# Patient Record
Sex: Male | Born: 1980
Health system: Southern US, Community
[De-identification: ages and names within clinical notes are randomized; demographics above are authoritative.]

## PROBLEM LIST (undated history)

## (undated) DIAGNOSIS — M545 Low back pain, unspecified: Secondary | ICD-10-CM

## (undated) DIAGNOSIS — S62309A Unspecified fracture of unspecified metacarpal bone, initial encounter for closed fracture: Secondary | ICD-10-CM

## (undated) DIAGNOSIS — G8929 Other chronic pain: Secondary | ICD-10-CM

## (undated) HISTORY — PX: STOMACH SURGERY: SHX791

## (undated) HISTORY — DX: Low back pain, unspecified: M54.50

## (undated) HISTORY — DX: Other chronic pain: G89.29

## (undated) HISTORY — PX: INGUINAL HERNIA REPAIR: SHX194

---

## 2001-03-07 ENCOUNTER — Emergency Department (HOSPITAL_COMMUNITY): Admission: EM | Admit: 2001-03-07 | Discharge: 2001-03-07 | Payer: Self-pay | Admitting: Emergency Medicine

## 2011-08-14 ENCOUNTER — Emergency Department (HOSPITAL_COMMUNITY)
Admission: EM | Admit: 2011-08-14 | Discharge: 2011-08-14 | Disposition: A | Discharge status: Home / Self Care | Payer: Self-pay | Source: Home / Self Care

## 2011-08-14 ENCOUNTER — Encounter (HOSPITAL_COMMUNITY): Payer: Self-pay | Admitting: *Deleted

## 2011-08-14 ENCOUNTER — Emergency Department (INDEPENDENT_AMBULATORY_CARE_PROVIDER_SITE_OTHER): Payer: Self-pay

## 2011-08-14 DIAGNOSIS — S91119A Laceration without foreign body of unspecified toe without damage to nail, initial encounter: Secondary | ICD-10-CM

## 2011-08-14 DIAGNOSIS — S91109A Unspecified open wound of unspecified toe(s) without damage to nail, initial encounter: Secondary | ICD-10-CM

## 2011-08-14 MED ORDER — HYDROCODONE-ACETAMINOPHEN 5-325 MG PO TABS: 1.0000 | ORAL_TABLET | Freq: Four times a day (QID) | ORAL | Status: AC | PRN

## 2011-08-14 MED ORDER — HYDROCODONE-ACETAMINOPHEN 5-325 MG PO TABS
1.0000 | ORAL_TABLET | Freq: Once | ORAL | Status: AC
  Administered 2011-08-14: 1 via ORAL

## 2011-08-14 MED ORDER — HYDROCODONE-ACETAMINOPHEN 5-325 MG PO TABS
ORAL_TABLET | ORAL | Status: AC
  Filled 2011-08-14: qty 1

## 2011-08-14 MED ORDER — IBUPROFEN 800 MG PO TABS: 800.0000 mg | ORAL_TABLET | Freq: Three times a day (TID) | ORAL | Status: AC

## 2011-08-14 NOTE — ED Notes (Signed)
Hit right little toe on door during the night, laceration to toe, unable to wear shoe, states last tetanus was less than 5 yrs ago

## 2011-08-14 NOTE — ED Provider Notes (Signed)
History     CSN: [id number]  Arrival date & time 08/14/11  1207   None     Chief Complaint  Patient presents with  . Extremity Laceration    (Consider location/radiation/quality/duration/timing/severity/associated sxs/prior treatment) HPI Comments: Pt got up in middle of night to attend to crying child, slipped, fell down, and in process of falling, kicked a wood door with his toe, causing laceration and pain. Denies any other injuries.  Patient is a 31 y.o. male presenting with skin laceration. The history is provided by the patient.  Laceration  The incident occurred 12 to 24 hours ago. The laceration is located on the right foot. Injury mechanism: kicked a wood door. The pain is at a severity of 7/10. The pain is moderate. The pain has been constant since onset. His tetanus status is UTD.    History reviewed. No pertinent past medical history.  History reviewed. No pertinent past surgical history.  History reviewed. No pertinent family history.  History  Substance Use Topics  . Smoking status: Current Everyday Smoker -- 0.5 packs/day  . Smokeless tobacco: Not on file  . Alcohol Use: No      Review of Systems  Constitutional: Negative for fever and chills.  Musculoskeletal:       Toe pain  Skin: Positive for wound.    Allergies  Review of patient's allergies indicates no known allergies.  Home Medications   Current Outpatient Rx  Name Route Sig Dispense Refill  . HYDROCODONE-ACETAMINOPHEN 5-325 MG PO TABS Oral Take 1-2 tablets by mouth every 6 (six) hours as needed for pain. 20 tablet 0  . IBUPROFEN 800 MG PO TABS Oral Take 1 tablet (800 mg total) by mouth 3 (three) times daily. 21 tablet 0    BP 147/86  Pulse 89  Temp(Src) 97.6 F (36.4 C) (Oral)  Resp 14  SpO2 96%  Physical Exam  Constitutional: He appears well-developed and well-nourished. No distress.  HENT:  Head: Normocephalic and atraumatic.  Pulmonary/Chest: Effort normal.  Musculoskeletal:  He exhibits edema and tenderness.       Feet:  Skin:       ED Course  Procedures (including critical care time)  Labs Reviewed - No data to display Dg Toe 5th Right  08/14/2011  *RADIOLOGY REPORT*  Clinical Data: Trauma.  Laceration  RIGHT FIFTH TOE  Comparison: None  Findings: There is no evidence of fracture or dislocation.  There is no evidence of arthropathy or other focal bone abnormality. Soft tissues are unremarkable.  IMPRESSION: No acute findings.  Original Report Authenticated By: Rosealee Albee, M.D.     1. Laceration of toe of right foot       MDM          Cathlyn Parsons, NP 08/14/11 1551

## 2011-08-15 NOTE — ED Provider Notes (Signed)
Medical screening examination/treatment/procedure(s) were performed by non-physician practitioner and as supervising physician I was immediately available for consultation/collaboration.  Luiz Blare MD   Luiz Blare, MD 08/15/11 [phone]

## 2011-08-16 ENCOUNTER — Telehealth (HOSPITAL_COMMUNITY): Payer: Self-pay | Admitting: *Deleted

## 2011-08-17 ENCOUNTER — Telehealth (HOSPITAL_COMMUNITY): Payer: Self-pay | Admitting: *Deleted

## 2011-08-17 NOTE — ED Notes (Signed)
Continue telephone encounter 1/30 @ 1809:  Pt. states the steri strips fell off and now his foot is hurting. I asked him if he was the pt. I told to come back and get the steri strips reapplied.  He said yes but he did not have transportation. I told him we don't call in refills of pain medication. He needs to see the doctor to have wound rechecked.  It is to late now to reapply the strips. It should have been done right away before bacteria could get into the wound. The doctor needs to check for infection. I could not promise he would be given more pain medication. Pt. Asked when we closed and I said 2000. [name] 08/17/2011

## 2012-01-04 ENCOUNTER — Emergency Department (HOSPITAL_COMMUNITY)
Admission: EM | Admit: 2012-01-04 | Discharge: 2012-01-04 | Disposition: A | Discharge status: Home / Self Care | Payer: Self-pay | Attending: Emergency Medicine | Admitting: Emergency Medicine

## 2012-01-04 ENCOUNTER — Encounter (HOSPITAL_COMMUNITY): Payer: Self-pay | Admitting: Emergency Medicine

## 2012-01-04 DIAGNOSIS — K029 Dental caries, unspecified: Secondary | ICD-10-CM | POA: Insufficient documentation

## 2012-01-04 DIAGNOSIS — F17200 Nicotine dependence, unspecified, uncomplicated: Secondary | ICD-10-CM | POA: Insufficient documentation

## 2012-01-04 MED ORDER — TRAMADOL HCL 50 MG PO TABS: 50.0000 mg | ORAL_TABLET | Freq: Once | ORAL | Status: AC

## 2012-01-04 MED ORDER — TRAMADOL HCL 50 MG PO TABS
50.0000 mg | ORAL_TABLET | Freq: Once | ORAL | Status: AC
  Administered 2012-01-04: 50 mg via ORAL
  Filled 2012-01-04: qty 1

## 2012-01-04 NOTE — Discharge Instructions (Signed)
Dental Caries  Tooth decay (dental caries, cavities) is the most common of all oral diseases. It occurs in all ages but is more common in children and young adults.  CAUSES  Bacteria in your mouth combine with foods (particularly sugars and starches) to produce plaque. Plaque is a substance that sticks to the hard surfaces of teeth. The bacteria in the plaque produce acids that attack the enamel of teeth. Repeated acid attacks dissolve the enamel and create holes in the teeth. Root surfaces of teeth may also get these holes.  Other contributing factors include:   Frequent snacking and drinking of cavity-producing foods and liquids.   Poor oral hygiene.   Dry mouth.   Substance abuse such as methamphetamine.   Broken or poor fitting dental restorations.   Eating disorders.   Gastroesophageal reflux disease (GERD).   Certain radiation treatments to the head and neck.  SYMPTOMS  At first, dental decay appears as white, chalky areas on the enamel. In this early stage, symptoms are seldom present. As the decay progresses, pits and holes may appear on the enamel surfaces. Progression of the decay will lead to softening of the hard layers of the tooth. At this point you may experience some pain or achy feeling after sweet, hot, or cold foods or drinks are consumed. If left untreated, the decay will reach the internal structures of the tooth and produce severe pain. Extensive dental treatment, such as root canal therapy, may be needed to save the tooth at this late stage of decay development.  DIAGNOSIS  Most cavities will be detected during regular check-ups. A thorough medical and dental history will be taken by the dentist. The dentist will use instruments to check the surfaces of your teeth for any breakdown or discoloration. Some dentists have special instruments, such as lasers, that detect tooth decay. Dental X-rays may also show some cavities that are not visible to the eye (such as between  the contact areas of the teeth). TREATMENT  Treatment involves removal of the tooth decay and replacement with a restorative material such as silver, gold, or composite (white) material. However, if the decay involves a large area of the tooth and there is little remaining healthy tooth structure, a cap (crown) will be fitted over the remaining structure. If the decay involves the center part of the tooth (pulp), root canal treatment will be needed before any type of dental restoration is placed. If the tooth is severely destroyed by the decay process, leaving the remaining tooth structures unrestorable, the tooth will need to be pulled (extracted). Some early tooth decay may be reversed by fluoride treatments and thorough brushing and flossing at home. PREVENTION   Eat healthy foods. Restrict the amount of sugary, starchy foods and liquids you consume. Avoid frequent snacking and drinking of unhealthy foods and liquids.   Sealants can help with prevention of cavities. Sealants are composite resins applied onto the biting surfaces of teeth at risk for decay. They smooth out the pits and grooves and prevent food from being trapped in them. This is done in early childhood before tooth decay has started.   Fluoride tablets may also be prescribed to children between 6 months and 10 years of age if your drinking water is not fluoridated. The fluoride absorbed by the tooth enamel makes teeth less susceptible to decay. Thorough daily cleaning with a toothbrush and dental floss is the best way to prevent cavities. Use of a fluoride toothpaste is highly recommended. Fluoride mouth rinses   may be used in specific cases.   Topical application of fluoride by your dentist is important in children.   Regular visits with a dentist for checkups and cleanings are also important.  SEEK IMMEDIATE DENTAL CARE IF:  You have a fever.   You develop redness and swelling of your face, jaw, or neck.   You develop swelling  around a tooth.   You are unable to open your mouth or cannot swallow.   You have severe pain uncontrolled by pain medicine.  Document Released: 03/26/2002 Document Revised: 06/23/2011 Document Reviewed: 12/09/2010 [facility] Patient Information 2012 [place], [place].  Discussed.  He did get some temporary dental filling from the pharmacy.  Also, as discussed, call the referred dentist first thing in the morning.  If they are not there on Friday.  Call first thing Monday morning and expanded referred through the emergency department

## 2012-01-04 NOTE — ED Provider Notes (Signed)
History     CSN: [id number]  Arrival date & time 01/04/12  1742   First MD Initiated Contact with Patient 01/04/12 2029      Chief Complaint  Patient presents with  . Dental Pain    (Consider location/radiation/quality/duration/timing/severity/associated sxs/prior treatment) Patient is a 31 y.o. male presenting with tooth pain. The history is provided by the patient.  Dental PainThe primary symptoms include mouth pain. Primary symptoms do not include dental injury, headaches, fever or sore throat. The symptoms began 3 to 5 days ago. The symptoms are worsening. The symptoms occur constantly.  Additional symptoms do not include: trouble swallowing.    History reviewed. No pertinent past medical history.  History reviewed. No pertinent past surgical history.  No family history on file.  History  Substance Use Topics  . Smoking status: Current Everyday Smoker -- 0.5 packs/day  . Smokeless tobacco: Not on file  . Alcohol Use: No      Review of Systems  Constitutional: Negative for fever and chills.  HENT: Positive for dental problem. Negative for sore throat and trouble swallowing.   Neurological: Negative for dizziness and headaches.    Allergies  Review of patient's allergies indicates no known allergies.  Home Medications   Current Outpatient Rx  Name Route Sig Dispense Refill  . ACETAMINOPHEN 500 MG PO TABS Oral Take 2,000 mg by mouth every 6 (six) hours as needed. For pain    . IBUPROFEN 200 MG PO TABS Oral Take 800 mg by mouth every 6 (six) hours as needed. For pain    . TRAMADOL HCL 50 MG PO TABS Oral Take 1 tablet (50 mg total) by mouth once. 30 tablet 0    BP 134/85  Pulse 92  Temp 98.6 F (37 C) (Oral)  Resp 18  SpO2 98%  Physical Exam  Constitutional: He is oriented to person, place, and time. He appears well-developed.  HENT:  Mouth/Throat:    Cardiovascular: Normal rate.   Pulmonary/Chest: Effort normal.  Abdominal: Soft.  Musculoskeletal:  Normal range of motion.  Neurological: He is alert and oriented to person, place, and time.  Skin: Skin is warm.    ED Course  Procedures (including critical care time)  Labs Reviewed - No data to display No results found.   1. Dental cavity       MDM   Dental cavity        Arman Filter, NP 01/04/12 2127  Arman Filter, NP 01/04/12 2127

## 2012-01-04 NOTE — ED Notes (Signed)
Pt c/o toothache left upper back tooth x's 2 days.  Has appt with dentist  Dr. Manson Passey in 2 weeks

## 2012-01-05 NOTE — ED Provider Notes (Signed)
Medical screening examination/treatment/procedure(s) were performed by non-physician practitioner and as supervising physician I was immediately available for consultation/collaboration.   Carleene Cooper III, MD 01/05/12 [phone]

## 2012-01-14 ENCOUNTER — Emergency Department (INDEPENDENT_AMBULATORY_CARE_PROVIDER_SITE_OTHER)
Admission: EM | Admit: 2012-01-14 | Discharge: 2012-01-14 | Disposition: A | Discharge status: Home / Self Care | Payer: Self-pay | Source: Home / Self Care | Attending: Family Medicine | Admitting: Family Medicine

## 2012-01-14 ENCOUNTER — Encounter (HOSPITAL_COMMUNITY): Payer: Self-pay | Admitting: Emergency Medicine

## 2012-01-14 DIAGNOSIS — K047 Periapical abscess without sinus: Secondary | ICD-10-CM

## 2012-01-14 MED ORDER — HYDROCODONE-ACETAMINOPHEN 5-325 MG PO TABS: ORAL_TABLET | ORAL | Status: DC

## 2012-01-14 MED ORDER — PENICILLIN V POTASSIUM 500 MG PO TABS: 500.0000 mg | ORAL_TABLET | Freq: Four times a day (QID) | ORAL | Status: AC

## 2012-01-14 MED ORDER — KETOROLAC TROMETHAMINE 60 MG/2ML IM SOLN
60.0000 mg | Freq: Once | INTRAMUSCULAR | Status: AC
  Administered 2012-01-14: 60 mg via INTRAMUSCULAR

## 2012-01-14 MED ORDER — KETOROLAC TROMETHAMINE 60 MG/2ML IM SOLN
INTRAMUSCULAR | Status: AC
  Filled 2012-01-14: qty 2

## 2012-01-14 MED ORDER — IBUPROFEN 800 MG PO TABS: 800.0000 mg | ORAL_TABLET | Freq: Three times a day (TID) | ORAL | Status: AC

## 2012-01-14 NOTE — Discharge Instructions (Signed)
Follow up with a dentist. Your tooth and your pain will not get better until a dentist removes the broken tooth.  Finish ALL of the antibiotics, even if you are feeling better.   Abscessed Tooth An abscessed tooth is an infection around your tooth. It may be caused by holes or damage to the tooth (cavity) or a dental disease. An abscessed tooth causes mild to very bad pain in and around the tooth. See your dentist right away if you have tooth or gum pain. HOME CARE  Take your medicine as told. Finish it even if you start to feel better.   Do not drive after taking pain medicine.   Rinse your mouth (gargle) often with salt water ( teaspoon salt in 8 ounces of warm water).   Do not apply heat to the outside of your face.  GET HELP RIGHT AWAY IF:   You have a temperature by mouth above 102 F (38.9 C), not controlled by medicine.   You have chills and a very bad headache.   You have problems breathing or swallowing.   Your mouth will not open.   You develop puffiness (swelling) on the neck or around the eye.   Your pain is not helped by medicine.   Your pain is getting worse instead of better.  MAKE SURE YOU:   Understand these instructions.   Will watch your condition.   Will get help right away if you are not doing well or get worse.  Document Released: 12/21/2007 Document Revised: 06/23/2011 Document Reviewed: 10/12/2010 [facility] Patient Information 2012 [place], [place].

## 2012-01-14 NOTE — ED Notes (Signed)
Tooth ache onset 3 days ago.  Pain has worsened .  Painful tooth is top, left , back per patient.  Reports this tooth has been painful before, "i think it broke"

## 2012-01-14 NOTE — ED Provider Notes (Signed)
History     CSN: [id number]  Arrival date & time 01/14/12  1242   First MD Initiated Contact with Patient 01/14/12 1506      Chief Complaint  Patient presents with  . Dental Injury    (Consider location/radiation/quality/duration/timing/severity/associated sxs/prior treatment) HPI Comments: Pt with old broken tooth/decayed tooth with frequent pain.  Worse in last 4-5 days.  Has been taking amoxicillin 500mg  BID from a friend to help with pain and facial swelling for last 3 days but it doesn't seem to be helping.  Tramadol from ER did not help pain.   Patient is a 31 y.o. male presenting with dental injury. The history is provided by the patient.  Dental Injury This is a chronic problem. Episode onset: tooth broke "awhile ago" The problem occurs constantly. The problem has been gradually worsening. The symptoms are aggravated by eating. Nothing relieves the symptoms. Treatments tried: tramadol. The treatment provided no relief.    Past Medical History  Diagnosis Date  . History of stomach ulcers     History reviewed. No pertinent past surgical history.  History reviewed. No pertinent family history.  History  Substance Use Topics  . Smoking status: Current Everyday Smoker -- 0.5 packs/day  . Smokeless tobacco: Not on file  . Alcohol Use: No      Review of Systems  Constitutional: Negative for fever and chills.  HENT: Positive for dental problem.   Skin: Negative for color change.    Allergies  Review of patient's allergies indicates no known allergies.  Home Medications   Current Outpatient Rx  Name Route Sig Dispense Refill  . IBUPROFEN 200 MG PO TABS Oral Take 800 mg by mouth every 6 (six) hours as needed. For pain    . ACETAMINOPHEN 500 MG PO TABS Oral Take 2,000 mg by mouth every 6 (six) hours as needed. For pain    . HYDROCODONE-ACETAMINOPHEN 5-325 MG PO TABS  1-2 tabs every 4-6 hours prn pain 10 tablet 0  . IBUPROFEN 800 MG PO TABS Oral Take 1 tablet (800  mg total) by mouth 3 (three) times daily. 21 tablet 0  . PENICILLIN V POTASSIUM 500 MG PO TABS Oral Take 1 tablet (500 mg total) by mouth 4 (four) times daily. 40 tablet 0  . TRAMADOL HCL 50 MG PO TABS Oral Take 1 tablet (50 mg total) by mouth once. 30 tablet 0    BP 139/94  Pulse 89  Temp 98.5 F (36.9 C) (Oral)  Resp 16  SpO2 100%  Physical Exam  Constitutional: He appears well-developed and well-nourished.       Uncomfortable appearing  HENT:  Mouth/Throat:         Swelling of face lateral to affected tooth.    Lymphadenopathy:       Head (right side): No submental, no submandibular and no tonsillar adenopathy present.       Head (left side): No submental, no submandibular and no tonsillar adenopathy present.    ED Course  Procedures (including critical care time)  Labs Reviewed - No data to display No results found.   1. Dental abscess       MDM  Given facial swelling, concern for abscessed tooth.  Will tx with appropriate dose penicillin, pt to d/c amox from his friend.  Stressed to pt pain will not resolve until sees a dentist.         Cathlyn Parsons, NP 01/14/12 1527

## 2012-01-15 NOTE — ED Provider Notes (Signed)
Medical screening examination/treatment/procedure(s) were performed by resident physician or non-physician practitioner and as supervising physician I was immediately available for consultation/collaboration.   Reagen Goates DOUGLAS MD.    Deverick Pruss D Samaia Iwata, MD 01/15/12 1224 

## 2012-04-07 ENCOUNTER — Emergency Department (INDEPENDENT_AMBULATORY_CARE_PROVIDER_SITE_OTHER): Payer: Self-pay

## 2012-04-07 ENCOUNTER — Emergency Department (INDEPENDENT_AMBULATORY_CARE_PROVIDER_SITE_OTHER)
Admission: EM | Admit: 2012-04-07 | Discharge: 2012-04-07 | Disposition: A | Discharge status: Home / Self Care | Payer: Self-pay | Source: Home / Self Care | Attending: Emergency Medicine | Admitting: Emergency Medicine

## 2012-04-07 ENCOUNTER — Encounter (HOSPITAL_COMMUNITY): Payer: Self-pay | Admitting: Emergency Medicine

## 2012-04-07 DIAGNOSIS — S40019A Contusion of unspecified shoulder, initial encounter: Secondary | ICD-10-CM

## 2012-04-07 DIAGNOSIS — M62838 Other muscle spasm: Secondary | ICD-10-CM

## 2012-04-07 MED ORDER — NAPROXEN 500 MG PO TABS: 500.0000 mg | ORAL_TABLET | Freq: Two times a day (BID) | ORAL | Status: DC

## 2012-04-07 MED ORDER — HYDROCODONE-ACETAMINOPHEN 5-325 MG PO TABS
ORAL_TABLET | ORAL | Status: AC
  Filled 2012-04-07: qty 2

## 2012-04-07 MED ORDER — HYDROCODONE-ACETAMINOPHEN 5-325 MG PO TABS: 2.0000 | ORAL_TABLET | ORAL | Status: DC | PRN

## 2012-04-07 MED ORDER — TIZANIDINE HCL 4 MG PO TABS: 4.0000 mg | ORAL_TABLET | Freq: Two times a day (BID) | ORAL | Status: DC

## 2012-04-07 MED ORDER — IBUPROFEN 800 MG PO TABS
ORAL_TABLET | ORAL | Status: AC
  Filled 2012-04-07: qty 1

## 2012-04-07 MED ORDER — IBUPROFEN 800 MG PO TABS
800.0000 mg | ORAL_TABLET | Freq: Once | ORAL | Status: AC
  Administered 2012-04-07: 800 mg via ORAL

## 2012-04-07 MED ORDER — HYDROCODONE-ACETAMINOPHEN 5-325 MG PO TABS
2.0000 | ORAL_TABLET | Freq: Once | ORAL | Status: AC
  Administered 2012-04-07: 2 via ORAL
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
## 2015-06-03 ENCOUNTER — Emergency Department (HOSPITAL_COMMUNITY): Payer: Self-pay

## 2015-06-03 ENCOUNTER — Encounter (HOSPITAL_COMMUNITY): Payer: Self-pay | Admitting: Emergency Medicine

## 2015-06-03 ENCOUNTER — Emergency Department (HOSPITAL_COMMUNITY)
Admission: EM | Admit: 2015-06-03 | Discharge: 2015-06-04 | Disposition: A | Discharge status: Home / Self Care | Payer: Self-pay | Attending: Emergency Medicine | Admitting: Emergency Medicine

## 2015-06-03 DIAGNOSIS — K610 Anal abscess: Secondary | ICD-10-CM | POA: Insufficient documentation

## 2015-06-03 DIAGNOSIS — F17200 Nicotine dependence, unspecified, uncomplicated: Secondary | ICD-10-CM | POA: Insufficient documentation

## 2015-06-03 DIAGNOSIS — Z791 Long term (current) use of non-steroidal anti-inflammatories (NSAID): Secondary | ICD-10-CM | POA: Insufficient documentation

## 2015-06-03 LAB — CBC WITH DIFFERENTIAL/PLATELET
BASOS ABS: 0 10*3/uL (ref 0.0–0.1)
BASOS PCT: 0 %
Eosinophils Absolute: 0.1 10*3/uL (ref 0.0–0.7)
Eosinophils Relative: 1 %
HEMATOCRIT: 43.7 % (ref 39.0–52.0)
HEMOGLOBIN: 14.2 g/dL (ref 13.0–17.0)
LYMPHS PCT: 19 %
Lymphs Abs: 1.9 10*3/uL (ref 0.7–4.0)
MCH: 31.8 pg (ref 26.0–34.0)
MCHC: 32.5 g/dL (ref 30.0–36.0)
MCV: 98 fL (ref 78.0–100.0)
MONO ABS: 1 10*3/uL (ref 0.1–1.0)
Monocytes Relative: 10 %
NEUTROS ABS: 7.4 10*3/uL (ref 1.7–7.7)
NEUTROS PCT: 70 %
Platelets: 259 10*3/uL (ref 150–400)
RBC: 4.46 MIL/uL (ref 4.22–5.81)
RDW: 13 % (ref 11.5–15.5)
WBC: 10.5 10*3/uL (ref 4.0–10.5)

## 2015-06-03 LAB — I-STAT CHEM 8, ED
BUN: 20 mg/dL (ref 6–20)
CHLORIDE: 102 mmol/L (ref 101–111)
CREATININE: 1.2 mg/dL (ref 0.61–1.24)
Calcium, Ion: 1.16 mmol/L (ref 1.12–1.23)
Glucose, Bld: 88 mg/dL (ref 65–99)
HEMATOCRIT: 45 % (ref 39.0–52.0)
HEMOGLOBIN: 15.3 g/dL (ref 13.0–17.0)
POTASSIUM: 4.3 mmol/L (ref 3.5–5.1)
Sodium: 140 mmol/L (ref 135–145)
TCO2: 26 mmol/L (ref 0–100)

## 2015-06-03 MED ORDER — SODIUM CHLORIDE 0.9 % IV BOLUS (SEPSIS)
500.0000 mL | Freq: Once | INTRAVENOUS | Status: AC
  Administered 2015-06-03: 500 mL via INTRAVENOUS

## 2015-06-03 MED ORDER — IOHEXOL 300 MG/ML  SOLN
100.0000 mL | Freq: Once | INTRAMUSCULAR | Status: AC | PRN
  Administered 2015-06-03: 100 mL via INTRAVENOUS

## 2015-06-03 NOTE — ED Notes (Signed)
RN placing IV and drawing labs

## 2015-06-03 NOTE — ED Notes (Signed)
Pt states that he has had an area above his rectum but 'inside' that has been painful x 1 week. States that he has had problems having a BM as well. Alert and oriented.

## 2015-06-04 ENCOUNTER — Telehealth: Payer: Self-pay | Admitting: *Deleted

## 2015-06-04 MED ORDER — METRONIDAZOLE 500 MG PO TABS: 500.0000 mg | ORAL_TABLET | Freq: Three times a day (TID) | ORAL | Status: DC

## 2015-06-04 MED ORDER — TRAMADOL HCL 50 MG PO TABS: 50.0000 mg | ORAL_TABLET | Freq: Four times a day (QID) | ORAL | Status: DC | PRN

## 2015-06-04 MED ORDER — CIPROFLOXACIN HCL 500 MG PO TABS: 500.0000 mg | ORAL_TABLET | Freq: Two times a day (BID) | ORAL | Status: DC

## 2015-06-04 MED ORDER — POLYETHYLENE GLYCOL 3350 17 G PO PACK: 17.0000 g | PACK | Freq: Every day | ORAL | Status: DC

## 2015-06-04 MED ORDER — OXYCODONE-ACETAMINOPHEN 5-325 MG PO TABS
1.0000 | ORAL_TABLET | Freq: Once | ORAL | Status: AC
  Administered 2015-06-04: 1 via ORAL
  Filled 2015-06-04: qty 1

## 2015-06-04 NOTE — ED Provider Notes (Signed)
CSN: [id number]     Arrival date & time 06/03/15  1929 History   First MD Initiated Contact with Patient 06/03/15 2159     Chief Complaint  Patient presents with  . Rectal Pain     (Consider location/radiation/quality/duration/timing/severity/associated sxs/prior Treatment) The history is provided by the patient.   patient presents with rectal pain and pain with bowel movements. Also hurts to sit in certain positions. Care for the last week. No drainage. No fevers. No trauma. States nothing has been in his rectum. Pain is dull and constant.  Past Medical History  Diagnosis Date  . History of stomach ulcers    History reviewed. No pertinent past surgical history. History reviewed. No pertinent family history. Social History  Substance Use Topics  . Smoking status: Current Every Day Smoker -- 0.50 packs/day  . Smokeless tobacco: None  . Alcohol Use: No    Review of Systems  Constitutional: Negative for appetite change.  Respiratory: Negative for shortness of breath.   Cardiovascular: Negative for chest pain.  Gastrointestinal: Positive for rectal pain. Negative for abdominal pain.  Genitourinary: Negative for flank pain.  Musculoskeletal: Negative for joint swelling.  Skin: Negative for color change.  Neurological: Negative for light-headedness.  Psychiatric/Behavioral: Negative for confusion.      Allergies  Review of patient's allergies indicates no known allergies.  Home Medications   Prior to Admission medications   Medication Sig Start Date End Date Taking? Authorizing Provider  ibuprofen (ADVIL,MOTRIN) 200 MG tablet Take 400 mg by mouth every 6 (six) hours as needed for moderate pain.   Yes Historical Provider, MD  ciprofloxacin (CIPRO) 500 MG tablet Take 1 tablet (500 mg total) by mouth 2 (two) times daily. 06/04/15   Benjiman CoreNathan Jamoni Broadfoot, MD  HYDROcodone-acetaminophen (NORCO/VICODIN) 5-325 MG per tablet Take 2 tablets by mouth every 4 (four) hours as needed for  pain. Patient not taking: Reported on 06/03/2015 04/07/12   Domenick GongAshley Mortenson, MD  metroNIDAZOLE (FLAGYL) 500 MG tablet Take 1 tablet (500 mg total) by mouth 3 (three) times daily. 06/04/15   Benjiman CoreNathan Ulas Zuercher, MD  naproxen (NAPROSYN) 500 MG tablet Take 1 tablet (500 mg total) by mouth 2 (two) times daily. Patient not taking: Reported on 06/03/2015 04/07/12   Domenick GongAshley Mortenson, MD  polyethylene glycol [facility](MIRALAX) packet Take 17 g by mouth daily. 06/04/15   Benjiman CoreNathan Mailee Klaas, MD  traMADol (ULTRAM) 50 MG tablet Take 1 tablet (50 mg total) by mouth every 6 (six) hours as needed. 06/04/15   Benjiman CoreNathan Tahara Ruffini, MD   BP 138/97 mmHg  Pulse 93  Temp(Src) 97.7 F (36.5 C)  Resp 18  Ht 5\' 7"  (1.702 m)  Wt 170 lb (77.111 kg)  BMI 26.62 kg/m2  SpO2 100% Physical Exam  Constitutional: He appears well-developed.  Cardiovascular: Normal rate.   Pulmonary/Chest: Effort normal.  Abdominal: Soft. There is no tenderness.  Genitourinary:  No external perianal mass. There is some posterior tenderness right behind the anus. No mass. No skin changes. On rectal exam there is posterior tenderness with some stool in the vault.  Neurological: He is alert.  Skin: Skin is warm.    ED Course  Procedures (including critical care time) Labs Review Labs Reviewed  CBC WITH DIFFERENTIAL/PLATELET  I-STAT CHEM 8, ED    Imaging Review Ct Abdomen Pelvis W Contrast  06/03/2015  CLINICAL DATA:  Acute onset of rectal pain for 1 week. Difficulty having bowel movement. Initial encounter. EXAM: CT ABDOMEN AND PELVIS WITH CONTRAST TECHNIQUE: Multidetector CT imaging of the  abdomen and pelvis was performed using the standard protocol following bolus administration of intravenous contrast. CONTRAST:  OMNIPAQUE IOHEXOL 300 MG/ML  SOLN COMPARISON:  None. FINDINGS: The visualized lung bases are clear. The liver and spleen are unremarkable in appearance. The gallbladder is within normal limits. The pancreas and adrenal glands are  unremarkable. The kidneys are unremarkable in appearance. There is no evidence of hydronephrosis. No renal or ureteral stones are seen. No perinephric stranding is appreciated. No free fluid is identified. The small bowel is unremarkable in appearance. The stomach is within normal limits. No acute vascular abnormalities are seen. The appendix is normal in caliber and contains air, without evidence of appendicitis. The colon is unremarkable in appearance. The rectum is within normal limits. Note is made of a small collection of fluid along the posterior aspect of the anorectal canal, measuring 1.9 x 1.2 cm, compatible with a perianal abscess. The bladder is mildly distended and grossly unremarkable. The prostate remains normal in size. No inguinal lymphadenopathy is seen. No acute osseous abnormalities are identified. IMPRESSION: Small collection of fluid along the posterior aspect of the anorectal canal, measuring 1.9 x 1.2 cm, compatible with a perianal abscess. Electronically Signed   By: Roanna Raider M.D.   On: 06/03/2015 23:38   I have personally reviewed and evaluated these images and lab results as part of my medical decision-making.   EKG Interpretation None      MDM   Final diagnoses:  Perianal abscess    Patient with perianal/rectal pain. No mass felt, but there is a perianal abscess on CT. Does not appear to be drainable through the skin at this time. We'll give antibiotics and pain medicine and follow-up with [facility] [place] [facility] at the urgent clinic so he can get in the next day or 2.    Benjiman Core, MD 06/04/15 [phone]

## 2015-06-04 NOTE — Telephone Encounter (Signed)
Pharmacy called stating pt presenting unsigned Rx.  NCM verified four Rx written 11/16 by Dr. Rubin PayorPickering.  Pharmacy states that pt only presenting Ultram Rx.  NCM advised that all Rx need to be filled.

## 2015-06-04 NOTE — Discharge Instructions (Signed)
Perianal Abscess An abscess is an infected area that contains a collection of pus and debris. A perianal abscess is one that occurs in the perineal area, which is the area between the anus and the scrotum in males and between the anus and the vagina in females. Perianal abscesses can vary in size. Without treatment, a perianal abscess can become larger and cause other problems. CAUSES  Glands in the perineal area can become plugged up with debris. When this happens, an abscess may form.  SIGNS AND SYMPTOMS  The most common symptoms of a perianal abscess are:  Swelling and redness in the area of the abscess. The redness may go beyond the abscess and appear as a red streak on the skin.  Pain in the area of the abscess. Other possible symptoms include:   A visible lump or a lump that can be felt when touching the area and is usually painful.  Bleeding or pus-like discharge from the area.  Fever.  General weakness. DIAGNOSIS  Your health care provider will take a medical history and examine the area. This may involve examining the rectal area with a gloved hand (digital rectal exam). For women, it may require a careful vaginal exam. Sometimes, the health care provider needs to look into the rectum using a probe or scope. TREATMENT  Treatment often requires making a cut (incision) in the abscess to drain the pus. This can sometimes be done in your health care provider's office or an emergency department after giving you medicine to numb the area (local anesthetic). For larger or deeper abscesses, surgery may be required to drain the abscess. Antibiotic medicines are sometimes given if there is infection of the surrounding tissue (cellulitis). In some cases, gauze is packed into the abscess to continue draining the area. Frequent sitz baths may be recommended to help the wound heal and to reduce the chance of the abscess coming back. HOME CARE INSTRUCTIONS   Only take over-the-counter or  prescription medicines for pain, fever, or discomfort as directed by your health care provider.  Take antibiotic medicine as directed. Make sure you finish it even if you start to feel better.  If gauze is used in the abscess, follow your health care provider's instructions for removing or changing the gauze. It can usually be removed in 2-3 days.  If one or more drains have been placed in the abscess cavity, be careful not to pull at them. Your health care provider will tell you how long they need to remain in place.  Take warm sitz baths 3-4 times a day and after bowel movements. This will help reduce pain and swelling.  Keep the skin around the abscess clean and dry. Avoid cleaning the area too much.  Avoid scratching the abscess area.  Avoid using colored or perfumed toilet papers. SEEK MEDICAL CARE IF:   You have trouble having a bowel movement or passing urine.  Your pain or swelling in the affected area does not seem to be improving.  The gauze packing or the drains come out before the planned time. SEEK IMMEDIATE MEDICAL CARE IF:   You have problems moving or using your legs.  You have severe or increasing pain.  Your swelling in the affected area suddenly gets worse.  You have a large increase in bleeding or passing of pus.  You have chills or a fever. MAKE SURE YOU:   Understand these instructions.  Will watch your condition.  Will get help right away if you are   not doing well or get worse.   This information is not intended to replace advice given to you by your health care provider. Make sure you discuss any questions you have with your health care provider.   Document Released: 08/10/2006 Document Revised: 04/24/2013 Document Reviewed: 02/13/2013 Elsevier Interactive Patient Education 2016 Elsevier Inc.  

## 2015-07-30 ENCOUNTER — Other Ambulatory Visit: Payer: Self-pay | Admitting: Physician Assistant

## 2015-07-30 DIAGNOSIS — K611 Rectal abscess: Secondary | ICD-10-CM

## 2015-07-31 ENCOUNTER — Inpatient Hospital Stay
Admission: RE | Admit: 2015-07-31 | Discharge: 2015-07-31 | Disposition: A | Discharge status: Home / Self Care | Payer: Self-pay | Source: Ambulatory Visit | Attending: Physician Assistant | Admitting: Physician Assistant

## 2015-09-29 ENCOUNTER — Encounter (HOSPITAL_COMMUNITY): Payer: Self-pay | Admitting: Emergency Medicine

## 2015-09-29 ENCOUNTER — Emergency Department (HOSPITAL_COMMUNITY)
Admission: EM | Admit: 2015-09-29 | Discharge: 2015-09-30 | Disposition: A | Discharge status: Home / Self Care | Payer: No Typology Code available for payment source | Attending: Emergency Medicine | Admitting: Emergency Medicine

## 2015-09-29 DIAGNOSIS — Y9389 Activity, other specified: Secondary | ICD-10-CM | POA: Insufficient documentation

## 2015-09-29 DIAGNOSIS — Y998 Other external cause status: Secondary | ICD-10-CM | POA: Insufficient documentation

## 2015-09-29 DIAGNOSIS — Z79899 Other long term (current) drug therapy: Secondary | ICD-10-CM | POA: Diagnosis not present

## 2015-09-29 DIAGNOSIS — M25512 Pain in left shoulder: Secondary | ICD-10-CM

## 2015-09-29 DIAGNOSIS — M545 Low back pain, unspecified: Secondary | ICD-10-CM

## 2015-09-29 DIAGNOSIS — S3992XA Unspecified injury of lower back, initial encounter: Secondary | ICD-10-CM | POA: Insufficient documentation

## 2015-09-29 DIAGNOSIS — F17200 Nicotine dependence, unspecified, uncomplicated: Secondary | ICD-10-CM | POA: Diagnosis not present

## 2015-09-29 DIAGNOSIS — Y92410 Unspecified street and highway as the place of occurrence of the external cause: Secondary | ICD-10-CM | POA: Diagnosis not present

## 2015-09-29 DIAGNOSIS — S4992XA Unspecified injury of left shoulder and upper arm, initial encounter: Secondary | ICD-10-CM | POA: Insufficient documentation

## 2015-09-29 DIAGNOSIS — Z8719 Personal history of other diseases of the digestive system: Secondary | ICD-10-CM | POA: Insufficient documentation
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
## 2015-09-30 ENCOUNTER — Emergency Department (HOSPITAL_COMMUNITY): Payer: No Typology Code available for payment source

## 2015-09-30 MED ORDER — TRAMADOL HCL 50 MG PO TABS
50.0000 mg | ORAL_TABLET | Freq: Four times a day (QID) | ORAL | Status: DC | PRN
Start: 2015-09-30 — End: 2016-08-16

## 2015-09-30 MED ORDER — TRAMADOL HCL 50 MG PO TABS
50.0000 mg | ORAL_TABLET | Freq: Once | ORAL | Status: AC
  Administered 2015-09-30: 50 mg via ORAL
  Filled 2015-09-30: qty 1

## 2015-09-30 MED ORDER — NAPROXEN 375 MG PO TABS: 375.0000 mg | ORAL_TABLET | Freq: Two times a day (BID) | ORAL | Status: DC

## 2015-09-30 MED ORDER — CYCLOBENZAPRINE HCL 10 MG PO TABS: 5.0000 mg | ORAL_TABLET | Freq: Two times a day (BID) | ORAL | Status: DC | PRN

## 2015-09-30 NOTE — ED Notes (Signed)
PA at bedside.
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
## 2015-09-30 NOTE — Discharge Instructions (Signed)
°Back Pain, Adult °Back pain is very common in adults. The cause of back pain is rarely dangerous and the pain often gets better over time. The cause of your back pain may not be known. Some common causes of back pain include: °· Strain of the muscles or ligaments supporting the spine. °· Wear and tear (degeneration) of the spinal disks. °· Arthritis. °· Direct injury to the back. °For many people, back pain may return. Since back pain is rarely dangerous, most people can learn to manage this condition on their own. °HOME CARE INSTRUCTIONS °Watch your back pain for any changes. The following actions may help to lessen any discomfort you are feeling: °· Remain active. It is stressful on your back to sit or stand in one place for long periods of time. Do not sit, drive, or stand in one place for more than 30 minutes at a time. Take short walks on even surfaces as soon as you are able. Try to increase the length of time you walk each day. °· Exercise regularly as directed by your health care provider. Exercise helps your back heal faster. It also helps avoid future injury by keeping your muscles strong and flexible. °· Do not stay in bed. Resting more than 1-2 days can delay your recovery. °· Pay attention to your body when you bend and lift. The most comfortable positions are those that put less stress on your recovering back. Always use proper lifting techniques, including: °¨ Bending your knees. °¨ Keeping the load close to your body. °¨ Avoiding twisting. °· Find a comfortable position to sleep. Use a firm mattress and lie on your side with your knees slightly bent. If you lie on your back, put a pillow under your knees. °· Avoid feeling anxious or stressed. Stress increases muscle tension and can worsen back pain. It is important to recognize when you are anxious or stressed and learn ways to manage it, such as with exercise. °· Take medicines only as directed by your health care provider. Over-the-counter  medicines to reduce pain and inflammation are often the most helpful. Your health care provider may prescribe muscle relaxant drugs. These medicines help dull your pain so you can more quickly return to your normal activities and healthy exercise. °· Apply ice to the injured area: °¨ Put ice in a plastic bag. °¨ Place a towel between your skin and the bag. °¨ Leave the ice on for 20 minutes, 2-3 times a day for the first 2-3 days. After that, ice and heat may be alternated to reduce pain and spasms. °· Maintain a healthy weight. Excess weight puts extra stress on your back and makes it difficult to maintain good posture. °SEEK MEDICAL CARE IF: °· You have pain that is not relieved with rest or medicine. °· You have increasing pain going down into the legs or buttocks. °· You have pain that does not improve in one week. °· You have night pain. °· You lose weight. °· You have a fever or chills. °SEEK IMMEDIATE MEDICAL CARE IF:  °· You develop new bowel or bladder control problems. °· You have unusual weakness or numbness in your arms or legs. °· You develop nausea or vomiting. °· You develop abdominal pain. °· You feel faint. °  °This information is not intended to replace advice given to you by your health care provider. Make sure you discuss any questions you have with your health care provider. °  °Document Released: 07/04/2005 Document Revised: 07/25/2014 Document Reviewed: 11/05/2013 °Elsevier Interactive Patient Education ©2016 Elsevier   Inc. °Motor Vehicle Collision °It is common to have multiple bruises and sore muscles after a motor vehicle collision (MVC). These tend to feel worse for the first 24 hours. You may have the most stiffness and soreness over the first several hours. You may also feel worse when you wake up the first morning after your collision. After this point, you will usually begin to improve with each day. The speed of improvement often depends on the severity of the collision, the number of  injuries, and the location and nature of these injuries. °HOME CARE INSTRUCTIONS °· Put ice on the injured area. °· Put ice in a plastic bag. °· Place a towel between your skin and the bag. °· Leave the ice on for 15-20 minutes, 3-4 times a day, or as directed by your health care provider. °· Drink enough fluids to keep your urine clear or pale yellow. Do not drink alcohol. °· Take a warm shower or bath once or twice a day. This will increase blood flow to sore muscles. °· You may return to activities as directed by your caregiver. Be careful when lifting, as this may aggravate neck or back pain. °· Only take over-the-counter or prescription medicines for pain, discomfort, or fever as directed by your caregiver. Do not use aspirin. This may increase bruising and bleeding. °SEEK IMMEDIATE MEDICAL CARE IF: °· You have numbness, tingling, or weakness in the arms or legs. °· You develop severe headaches not relieved with medicine. °· You have severe neck pain, especially tenderness in the middle of the back of your neck. °· You have changes in bowel or bladder control. °· There is increasing pain in any area of the body. °· You have shortness of breath, light-headedness, dizziness, or fainting. °· You have chest pain. °· You feel sick to your stomach (nauseous), throw up (vomit), or sweat. °· You have increasing abdominal discomfort. °· There is blood in your urine, stool, or vomit. °· You have pain in your shoulder (shoulder strap areas). °· You feel your symptoms are getting worse. °MAKE SURE YOU: °· Understand these instructions. °· Will watch your condition. °· Will get help right away if you are not doing well or get worse. °  °This information is not intended to replace advice given to you by your health care provider. Make sure you discuss any questions you have with your health care provider. °  °Document Released: 07/04/2005 Document Revised: 07/25/2014 Document Reviewed: 12/01/2010 °Elsevier Interactive  Patient Education ©2016 Elsevier Inc. ° °

## 2016-07-18 DIAGNOSIS — S62309A Unspecified fracture of unspecified metacarpal bone, initial encounter for closed fracture: Secondary | ICD-10-CM

## 2016-07-18 HISTORY — DX: Unspecified fracture of unspecified metacarpal bone, initial encounter for closed fracture: S62.309A

## 2016-08-11 ENCOUNTER — Emergency Department (HOSPITAL_BASED_OUTPATIENT_CLINIC_OR_DEPARTMENT_OTHER): Payer: Self-pay

## 2016-08-11 ENCOUNTER — Emergency Department (HOSPITAL_BASED_OUTPATIENT_CLINIC_OR_DEPARTMENT_OTHER)
Admission: EM | Admit: 2016-08-11 | Discharge: 2016-08-11 | Disposition: A | Discharge status: Home / Self Care | Payer: Self-pay | Attending: Emergency Medicine | Admitting: Emergency Medicine

## 2016-08-11 ENCOUNTER — Encounter (HOSPITAL_BASED_OUTPATIENT_CLINIC_OR_DEPARTMENT_OTHER): Payer: Self-pay | Admitting: *Deleted

## 2016-08-11 DIAGNOSIS — S62306A Unspecified fracture of fifth metacarpal bone, right hand, initial encounter for closed fracture: Secondary | ICD-10-CM

## 2016-08-11 DIAGNOSIS — W228XXA Striking against or struck by other objects, initial encounter: Secondary | ICD-10-CM | POA: Insufficient documentation

## 2016-08-11 DIAGNOSIS — Y999 Unspecified external cause status: Secondary | ICD-10-CM | POA: Insufficient documentation

## 2016-08-11 DIAGNOSIS — S62316A Displaced fracture of base of fifth metacarpal bone, right hand, initial encounter for closed fracture: Secondary | ICD-10-CM | POA: Insufficient documentation

## 2016-08-11 DIAGNOSIS — Y9389 Activity, other specified: Secondary | ICD-10-CM | POA: Insufficient documentation

## 2016-08-11 DIAGNOSIS — F17200 Nicotine dependence, unspecified, uncomplicated: Secondary | ICD-10-CM | POA: Insufficient documentation

## 2016-08-11 DIAGNOSIS — Y929 Unspecified place or not applicable: Secondary | ICD-10-CM | POA: Insufficient documentation

## 2016-08-11 MED ORDER — IBUPROFEN 600 MG PO TABS: 600.0000 mg | ORAL_TABLET | Freq: Four times a day (QID) | ORAL | 0 refills | Status: DC | PRN

## 2016-08-11 MED FILL — IBUPROFEN 600 MG TABLET: 600 | 7 days supply | Qty: 30 | Fill #0

## 2016-08-11 NOTE — Discharge Instructions (Signed)
Continue icing your hand to limit swelling. Take ibuprofen for pain. Call the office of Dr. Merlyn LotKuzma in the morning to schedule an appointment for follow-up to ensure proper healing of your broken bone. Wear the splint applied in the emergency department at all times. Do not remove unless told to do so by Dr. Merlyn LotKuzma.

## 2016-08-11 NOTE — ED Provider Notes (Signed)
[facility] Provider Note   CSN: [id number] Arrival date & time: 08/11/16  1136    History   Chief Complaint Chief Complaint  Patient presents with  . Hand Injury    HPI Michael Gentry is a 36 y.o. male.  36 year old male presents to the emergency department for evaluation of right hand pain. He states that he hit his hand on a 2x4 2 days ago while working on his truck. He states that his swelling has significantly improved. He also reports improvement in pain since onset of symptoms. He states that he is concerned about a broken bone and was encouraged to seek evaluation by his friend. He has noticed difficulty touching his thumb to his fifth finger. He believes this is due to swelling. He states that pain to his right hand is worse with pressure applied. No reported numbness.      Past Medical History:  Diagnosis Date  . History of stomach ulcers     There are no active problems to display for this patient.   Past Surgical History:  Procedure Laterality Date  . HERNIA REPAIR       Home Medications    Prior to Admission medications   Medication Sig Start Date End Date Taking? Authorizing Provider  ciprofloxacin (CIPRO) 500 MG tablet Take 1 tablet (500 mg total) by mouth 2 (two) times daily. Patient not taking: Reported on 09/29/2015 06/04/15   Benjiman CoreNathan Pickering, MD  cyclobenzaprine (FLEXERIL) 10 MG tablet Take 0.5-1 tablets (5-10 mg total) by mouth 2 (two) times daily as needed for muscle spasms. 09/30/15   Tiffany Neva SeatGreene, PA-C  ibuprofen (ADVIL,MOTRIN) 600 MG tablet Take 1 tablet (600 mg total) by mouth every 6 (six) hours as needed. 08/11/16   Antony MaduraKelly Cimberly Stoffel, PA-C  metroNIDAZOLE (FLAGYL) 500 MG tablet Take 1 tablet (500 mg total) by mouth 3 (three) times daily. Patient not taking: Reported on 09/29/2015 06/04/15   Benjiman CoreNathan Pickering, MD  naproxen (NAPROSYN) 375 MG tablet Take 1 tablet (375 mg total) by mouth 2 (two) times daily. 09/30/15   Tiffany Neva SeatGreene,  PA-C  polyethylene glycol [facility](MIRALAX) packet Take 17 g by mouth daily. Patient not taking: Reported on 09/29/2015 06/04/15   Benjiman CoreNathan Pickering, MD  traMADol (ULTRAM) 50 MG tablet Take 1 tablet (50 mg total) by mouth every 6 (six) hours as needed. Patient not taking: Reported on 09/29/2015 06/04/15   Benjiman CoreNathan Pickering, MD  traMADol (ULTRAM) 50 MG tablet Take 1 tablet (50 mg total) by mouth every 6 (six) hours as needed. 09/30/15   Marlon Peliffany Greene, PA-C    Family History No family history on file.  Social History Social History  Substance Use Topics  . Smoking status: Current Every Day Smoker    Packs/day: 0.50  . Smokeless tobacco: Never Used  . Alcohol use No     Allergies   Patient has no known allergies.   Review of Systems Review of Systems Ten systems reviewed and are negative for acute change, except as noted in the HPI.    Physical Exam Updated Vital Signs BP 121/87 (BP Location: Left Arm)   Pulse 81   Temp 98.4 F (36.9 C) (Oral)   Resp 17   Ht 5\' 6"  (1.676 m)   Wt 69.4 kg Comment: pt estimate  SpO2 100%   BMI 24.69 kg/m   Physical Exam  Constitutional: He is oriented to person, place, and time. He appears well-developed and well-nourished. No distress.  Nontoxic and in NAD  HENT:  Head: Normocephalic and atraumatic.  Eyes: Conjunctivae and EOM are normal. No scleral icterus.  Neck: Normal range of motion.  Cardiovascular: Normal rate, regular rhythm and intact distal pulses.   Distal radial pulse 2+ in the right upper extremities. Capillary refill brisk in all digits of right hand.  Pulmonary/Chest: Effort normal. No respiratory distress.  Respirations even and unlabored.  Musculoskeletal: He exhibits tenderness.       Right hand: He exhibits decreased range of motion (Decreased finger to thumb opposition between the right thumb and fifth finger. This is appreciated to be limited secondary to swelling.), tenderness, bony tenderness and swelling. He exhibits  normal capillary refill and no deformity. Normal sensation noted. Normal strength noted.       Hands: TTP along the right 5th metacarpal. No 5th MCP joint tenderness. No crepitus.  Neurological: He is alert and oriented to person, place, and time. He exhibits normal muscle tone. Coordination normal.  Sensation to light touch intact in the right hand and digits. Patient able to wiggle all fingers.  Skin: Skin is warm and dry. No rash noted. He is not diaphoretic. No erythema. No pallor.  Psychiatric: He has a normal mood and affect. His behavior is normal.  Nursing note and vitals reviewed.    ED Treatments / Results  Labs (all labs ordered are listed, but only abnormal results are displayed) Labs Reviewed - No data to display  EKG  EKG Interpretation None       Radiology Dg Hand Complete Right  Result Date: 08/11/2016 CLINICAL DATA:  Injury. EXAM: RIGHT HAND - COMPLETE 3+ VIEW COMPARISON:  No recent prior . FINDINGS: Fractured base of the right fifth metacarpal is noted. Fracture slightly displaced. No other focal abnormality identified . IMPRESSION: Fracture of the base of the right fifth metacarpal. Slight displacement. Electronically Signed   By: Maisie Fus  Register   On: 08/11/2016 12:09    Procedures Procedures (including critical care time)  Medications Ordered in ED Medications - No data to display   Initial Impression / Assessment and Plan / ED Course  I have reviewed the triage vital signs and the nursing notes.  Pertinent labs & imaging results that were available during my care of the patient were reviewed by me and considered in my medical decision making (see chart for details).     Patient presents to the emergency department for evaluation of right hand pain secondary to an injury that occurred 2 days ago. Patient is grossly neurovascularly intact with swelling along the ulnar aspect of his right hand. There is mild bruising to the palm. Tenderness to palpation  on exam correlates with evidence of metacarpal fracture on x-ray. Patient placed in ulnar gutter splint. Will refer to orthopedic hand surgery for follow-up. Supportive therapy instructions and return precautions given. Patient discharged in satisfactory condition with no unaddressed concerns.   Final Clinical Impressions(s) / ED Diagnoses   Final diagnoses:  Closed displaced fracture of fifth metacarpal bone of right hand, unspecified portion of metacarpal, initial encounter    New Prescriptions New Prescriptions   IBUPROFEN (ADVIL,MOTRIN) 600 MG TABLET    Take 1 tablet (600 mg total) by mouth every 6 (six) hours as needed.     Antony Madura, PA-C 08/11/16 1238    Jerelyn Scott, MD 08/11/16 1245

## 2016-08-11 NOTE — ED Triage Notes (Signed)
Right hand injury. He hit it on a 2x4. Swollen and painful.

## 2016-08-16 ENCOUNTER — Encounter (HOSPITAL_BASED_OUTPATIENT_CLINIC_OR_DEPARTMENT_OTHER): Payer: Self-pay | Admitting: *Deleted

## 2016-08-16 ENCOUNTER — Other Ambulatory Visit: Payer: Self-pay | Admitting: Orthopedic Surgery

## 2016-08-18 ENCOUNTER — Ambulatory Visit (HOSPITAL_BASED_OUTPATIENT_CLINIC_OR_DEPARTMENT_OTHER)
Admission: RE | Admit: 2016-08-18 | Discharge: 2016-08-18 | Disposition: A | Discharge status: Home / Self Care | Payer: Self-pay | Source: Ambulatory Visit | Attending: Orthopedic Surgery | Admitting: Orthopedic Surgery

## 2016-08-18 ENCOUNTER — Encounter (HOSPITAL_BASED_OUTPATIENT_CLINIC_OR_DEPARTMENT_OTHER)
Admission: RE | Disposition: A | Discharge status: Home / Self Care | Payer: Self-pay | Source: Ambulatory Visit | Attending: Orthopedic Surgery

## 2016-08-18 ENCOUNTER — Encounter (HOSPITAL_BASED_OUTPATIENT_CLINIC_OR_DEPARTMENT_OTHER): Payer: Self-pay | Admitting: *Deleted

## 2016-08-18 ENCOUNTER — Ambulatory Visit (HOSPITAL_BASED_OUTPATIENT_CLINIC_OR_DEPARTMENT_OTHER): Payer: Self-pay | Admitting: Anesthesiology

## 2016-08-18 DIAGNOSIS — S62316A Displaced fracture of base of fifth metacarpal bone, right hand, initial encounter for closed fracture: Secondary | ICD-10-CM | POA: Insufficient documentation

## 2016-08-18 DIAGNOSIS — F17200 Nicotine dependence, unspecified, uncomplicated: Secondary | ICD-10-CM | POA: Insufficient documentation

## 2016-08-18 DIAGNOSIS — X58XXXA Exposure to other specified factors, initial encounter: Secondary | ICD-10-CM | POA: Insufficient documentation

## 2016-08-18 DIAGNOSIS — Z880 Allergy status to penicillin: Secondary | ICD-10-CM | POA: Insufficient documentation

## 2016-08-18 HISTORY — DX: Unspecified fracture of unspecified metacarpal bone, initial encounter for closed fracture: S62.309A

## 2016-08-18 HISTORY — PX: OPEN REDUCTION INTERNAL FIXATION (ORIF) METACARPAL: SHX6234

## 2016-08-18 SURGERY — OPEN REDUCTION INTERNAL FIXATION (ORIF) METACARPAL
Anesthesia: General | Site: Finger | Laterality: Right

## 2016-08-18 MED ORDER — ONDANSETRON HCL 4 MG/2ML IJ SOLN
INTRAMUSCULAR | Status: AC
  Filled 2016-08-18: qty 2

## 2016-08-18 MED ORDER — LIDOCAINE 2% (20 MG/ML) 5 ML SYRINGE
INTRAMUSCULAR | Status: AC
  Filled 2016-08-18: qty 10

## 2016-08-18 MED ORDER — OXYCODONE-ACETAMINOPHEN 5-325 MG PO TABS: ORAL_TABLET | ORAL | 0 refills | Status: DC

## 2016-08-18 MED ORDER — EPHEDRINE 5 MG/ML INJ
INTRAVENOUS | Status: AC
  Filled 2016-08-18: qty 10

## 2016-08-18 MED ORDER — LACTATED RINGERS IV SOLN
INTRAVENOUS | Status: DC
  Administered 2016-08-18: 09:00:00 via INTRAVENOUS

## 2016-08-18 MED ORDER — CHLORHEXIDINE GLUCONATE 4 % EX LIQD: 60.0000 mL | Freq: Once | CUTANEOUS | Status: DC

## 2016-08-18 MED ORDER — FENTANYL CITRATE (PF) 100 MCG/2ML IJ SOLN
INTRAMUSCULAR | Status: AC
  Filled 2016-08-18: qty 2

## 2016-08-18 MED ORDER — SUFENTANIL CITRATE 50 MCG/ML IV SOLN
INTRAVENOUS | Status: DC | PRN
  Administered 2016-08-18 (×2): 10 ug via INTRAVENOUS

## 2016-08-18 MED ORDER — SUFENTANIL CITRATE 50 MCG/ML IV SOLN
INTRAVENOUS | Status: AC
  Filled 2016-08-18: qty 1

## 2016-08-18 MED ORDER — OXYCODONE HCL 5 MG PO TABS
ORAL_TABLET | ORAL | Status: AC
  Filled 2016-08-18: qty 1

## 2016-08-18 MED ORDER — PROPOFOL 500 MG/50ML IV EMUL
INTRAVENOUS | Status: AC
  Filled 2016-08-18: qty 50

## 2016-08-18 MED ORDER — BUPIVACAINE HCL (PF) 0.25 % IJ SOLN
INTRAMUSCULAR | Status: DC | PRN
  Administered 2016-08-18: 9 mL

## 2016-08-18 MED ORDER — OXYCODONE HCL 5 MG PO TABS
5.0000 mg | ORAL_TABLET | Freq: Once | ORAL | Status: AC | PRN
  Administered 2016-08-18: 5 mg via ORAL

## 2016-08-18 MED ORDER — PHENYLEPHRINE 40 MCG/ML (10ML) SYRINGE FOR IV PUSH (FOR BLOOD PRESSURE SUPPORT)
PREFILLED_SYRINGE | INTRAVENOUS | Status: AC
  Filled 2016-08-18: qty 10

## 2016-08-18 MED ORDER — FENTANYL CITRATE (PF) 100 MCG/2ML IJ SOLN: 50.0000 ug | INTRAMUSCULAR | Status: DC | PRN

## 2016-08-18 MED ORDER — ONDANSETRON HCL 4 MG/2ML IJ SOLN
INTRAMUSCULAR | Status: DC | PRN
  Administered 2016-08-18: 4 mg via INTRAVENOUS

## 2016-08-18 MED ORDER — MIDAZOLAM HCL 2 MG/2ML IJ SOLN
1.0000 mg | INTRAMUSCULAR | Status: DC | PRN
  Administered 2016-08-18: 2 mg via INTRAVENOUS

## 2016-08-18 MED ORDER — LIDOCAINE HCL (CARDIAC) 20 MG/ML IV SOLN
INTRAVENOUS | Status: DC | PRN
  Administered 2016-08-18: 30 mg via INTRAVENOUS

## 2016-08-18 MED ORDER — SUCCINYLCHOLINE CHLORIDE 200 MG/10ML IV SOSY
PREFILLED_SYRINGE | INTRAVENOUS | Status: AC
  Filled 2016-08-18: qty 10

## 2016-08-18 MED ORDER — HYDROMORPHONE HCL 1 MG/ML IJ SOLN
INTRAMUSCULAR | Status: AC
  Filled 2016-08-18: qty 1

## 2016-08-18 MED ORDER — ONDANSETRON HCL 4 MG/2ML IJ SOLN: 4.0000 mg | Freq: Once | INTRAMUSCULAR | Status: DC | PRN

## 2016-08-18 MED ORDER — PROPOFOL 10 MG/ML IV BOLUS
INTRAVENOUS | Status: DC | PRN
  Administered 2016-08-18: 150 mg via INTRAVENOUS

## 2016-08-18 MED ORDER — CEFAZOLIN SODIUM-DEXTROSE 2-4 GM/100ML-% IV SOLN
INTRAVENOUS | Status: AC
Start: 2016-08-18 — End: 2016-08-18
  Filled 2016-08-18: qty 100

## 2016-08-18 MED ORDER — DEXAMETHASONE SODIUM PHOSPHATE 10 MG/ML IJ SOLN
INTRAMUSCULAR | Status: AC
  Filled 2016-08-18: qty 1

## 2016-08-18 MED ORDER — MEPERIDINE HCL 25 MG/ML IJ SOLN: 6.2500 mg | INTRAMUSCULAR | Status: DC | PRN

## 2016-08-18 MED ORDER — DEXAMETHASONE SODIUM PHOSPHATE 10 MG/ML IJ SOLN
INTRAMUSCULAR | Status: DC | PRN
  Administered 2016-08-18: 10 mg via INTRAVENOUS

## 2016-08-18 MED ORDER — SCOPOLAMINE 1 MG/3DAYS TD PT72: 1.0000 | MEDICATED_PATCH | Freq: Once | TRANSDERMAL | Status: DC | PRN

## 2016-08-18 MED ORDER — MIDAZOLAM HCL 2 MG/2ML IJ SOLN
INTRAMUSCULAR | Status: AC
  Filled 2016-08-18: qty 2

## 2016-08-18 MED ORDER — HYDROMORPHONE HCL 1 MG/ML IJ SOLN
0.2500 mg | INTRAMUSCULAR | Status: DC | PRN
  Administered 2016-08-18 (×4): 0.5 mg via INTRAVENOUS

## 2016-08-18 SURGICAL SUPPLY — 51 items
BANDAGE ACE 3X5.8 VEL STRL LF (GAUZE/BANDAGES/DRESSINGS) ×3 IMPLANT
BIT DRILL 1.1 (BIT) ×1
BIT DRILL 1.1MM (BIT) ×1
BIT DRILL 60X20X1.1XQC TMX (BIT) ×1 IMPLANT
BIT DRL 60X20X1.1XQC TMX (BIT) ×1
BLADE SURG 15 STRL LF DISP TIS (BLADE) ×2 IMPLANT
BLADE SURG 15 STRL SS (BLADE) ×4
BNDG ESMARK 4X9 LF (GAUZE/BANDAGES/DRESSINGS) ×3 IMPLANT
BNDG GAUZE ELAST 4 BULKY (GAUZE/BANDAGES/DRESSINGS) ×3 IMPLANT
CHLORAPREP W/TINT 26ML (MISCELLANEOUS) ×3 IMPLANT
CORDS BIPOLAR (ELECTRODE) ×3 IMPLANT
COVER BACK TABLE 60X90IN (DRAPES) ×3 IMPLANT
COVER MAYO STAND STRL (DRAPES) ×3 IMPLANT
CUFF TOURNIQUET SINGLE 18IN (TOURNIQUET CUFF) ×3 IMPLANT
DRAPE EXTREMITY T 121X128X90 (DRAPE) ×3 IMPLANT
DRAPE OEC MINIVIEW 54X84 (DRAPES) ×3 IMPLANT
DRAPE SURG 17X23 STRL (DRAPES) ×3 IMPLANT
DRIVER BIT 1.5 (TRAUMA) ×3 IMPLANT
GAUZE SPONGE 4X4 12PLY STRL (GAUZE/BANDAGES/DRESSINGS) ×3 IMPLANT
GAUZE XEROFORM 1X8 LF (GAUZE/BANDAGES/DRESSINGS) ×3 IMPLANT
GLOVE BIO SURGEON STRL SZ7.5 (GLOVE) ×3 IMPLANT
GLOVE BIOGEL PI IND STRL 8 (GLOVE) ×1 IMPLANT
GLOVE BIOGEL PI INDICATOR 8 (GLOVE) ×2
GOWN STRL REUS W/ TWL LRG LVL3 (GOWN DISPOSABLE) ×1 IMPLANT
GOWN STRL REUS W/TWL LRG LVL3 (GOWN DISPOSABLE) ×2
GOWN STRL REUS W/TWL XL LVL3 (GOWN DISPOSABLE) ×3 IMPLANT
NEEDLE HYPO 25X1 1.5 SAFETY (NEEDLE) ×3 IMPLANT
NS IRRIG 1000ML POUR BTL (IV SOLUTION) ×3 IMPLANT
PACK BASIN DAY SURGERY FS (CUSTOM PROCEDURE TRAY) ×3 IMPLANT
PAD CAST 4YDX4 CTTN HI CHSV (CAST SUPPLIES) ×1 IMPLANT
PADDING CAST COTTON 4X4 STRL (CAST SUPPLIES) ×2
PLATE T SMALL 1.5MM (Plate) ×3 IMPLANT
SCREW NL 1.5X11 WRIST (Screw) ×9 IMPLANT
SCREW NL 1.5X12 (Screw) ×15 IMPLANT
SLEEVE SCD COMPRESS KNEE MED (MISCELLANEOUS) ×3 IMPLANT
SPLINT PLASTER CAST XFAST 3X15 (CAST SUPPLIES) ×20 IMPLANT
SPLINT PLASTER CAST XFAST 4X15 (CAST SUPPLIES) IMPLANT
SPLINT PLASTER XTRA FAST SET 4 (CAST SUPPLIES)
SPLINT PLASTER XTRA FASTSET 3X (CAST SUPPLIES) ×40
STOCKINETTE 4X48 STRL (DRAPES) ×3 IMPLANT
SUT CHROMIC 4 0 PS 2 18 (SUTURE) ×3 IMPLANT
SUT ETHILON 3 0 PS 1 (SUTURE) IMPLANT
SUT ETHILON 4 0 PS 2 18 (SUTURE) ×3 IMPLANT
SUT MERSILENE 4 0 P 3 (SUTURE) IMPLANT
SUT VIC AB 3-0 PS1 18 (SUTURE)
SUT VIC AB 3-0 PS1 18XBRD (SUTURE) IMPLANT
SUT VICRYL 4-0 PS2 18IN ABS (SUTURE) ×3 IMPLANT
SYR BULB 3OZ (MISCELLANEOUS) ×3 IMPLANT
SYR CONTROL 10ML LL (SYRINGE) ×3 IMPLANT
TOWEL OR 17X24 6PK STRL BLUE (TOWEL DISPOSABLE) ×6 IMPLANT
UNDERPAD 30X30 (UNDERPADS AND DIAPERS) ×3 IMPLANT

## 2016-08-18 NOTE — Transfer of Care (Incomplete)
Immediate Anesthesia Transfer of Care Note  Patient: Michael MedicoWilliam D Lueck Gentry  Procedure(s) Performed: Procedure(s): OPEN REDUCTION INTERNAL FIXATION (ORIF) RIGHT SMALL FINGER METACARPAL FRACTURE (Right)  Patient Location: PACU  Anesthesia Type:General  Level of Consciousness: awake, alert  and oriented  Airway & Oxygen Therapy: Patient Spontanous Breathing and Patient connected to face mask oxygen  Post-op Assessment: Report given to RN and Post -op Vital signs reviewed and stable  Post vital signs: Reviewed and stable  Last Vitals:  Vitals:   08/18/16 0817  BP: 124/73  Pulse: 66  Resp: 18  Temp: 36.6 C    Last Pain:  Vitals:   08/18/16 0837  TempSrc:   PainSc: 4       Patients Stated Pain Goal: 2 (08/18/16 0837)  Complications: {FINDINGS; ANE POST COMPLICATIONS:19485}

## 2016-08-18 NOTE — Anesthesia Postprocedure Evaluation (Signed)
Anesthesia Post Note  Patient: Michael MedicoWilliam D Cromartie Gentry  Procedure(s) Performed: Procedure(s) (LRB): OPEN REDUCTION INTERNAL FIXATION (ORIF) RIGHT SMALL FINGER METACARPAL FRACTURE (Right)  Patient location during evaluation: PACU Anesthesia Type: General Level of consciousness: awake and alert Pain management: pain level controlled Vital Signs Assessment: post-procedure vital signs reviewed and stable Respiratory status: spontaneous breathing, nonlabored ventilation, respiratory function stable and patient connected to nasal cannula oxygen Cardiovascular status: blood pressure returned to baseline and stable Postop Assessment: no signs of nausea or vomiting Anesthetic complications: no       Last Vitals:  Vitals:   08/18/16 1200 08/18/16 1230  BP: 131/88 (!) 142/93  Pulse: 76 60  Resp: 13 16  Temp:  36.6 C    Last Pain:  Vitals:   08/18/16 1230  TempSrc:   PainSc: 5                  Alfa Leibensperger DAVID

## 2016-08-18 NOTE — Op Note (Signed)
I assisted Surgeon(s) and Role:    * Betha LoaKevin Siraj Dermody, MD - Primary    * Cindee SaltGary Weaver Tweed, MD - Assisting on the Procedure(s): OPEN REDUCTION INTERNAL FIXATION (ORIF) RIGHT SMALL FINGER METACARPAL FRACTURE on 08/18/2016.  I provided assistance on this case as follows: approach, reduction, stabilization and fixation of the fracture.  Electronically signed by: Nicki ReaperKUZMA,Laria Grimmett R, MD Date: 08/18/2016 Time: 10:40 AM

## 2016-08-18 NOTE — Op Note (Signed)
NAME:  Michael Gentry, Michael Gentry            ACCOUNT NO.:  [id number]  MEDICAL RECORD NO.:  [id number]  LOCATION:                                 FACILITY:  PHYSICIAN:  Betha LoaKevin Jossalyn Forgione, MD             DATE OF BIRTH:  DATE OF PROCEDURE:  08/18/2016 DATE OF DISCHARGE:                              OPERATIVE REPORT   PREOPERATIVE DIAGNOSIS:  Right small finger metacarpal fracture.  POSTOPERATIVE DIAGNOSIS:  Right small finger metacarpal fracture.  PROCEDURE:  Open reduction and internal fixation of right small finger metacarpal fracture.  SURGEON:  Betha LoaKevin Tonio Seider, MD.  ASSISTANT:  Cindee SaltGary Marleah Beever, MD.  ANESTHESIA:  General.  IV FLUIDS:  Per anesthesia flow sheet.  ESTIMATED BLOOD LOSS:  Minimal.  COMPLICATIONS:  None.  SPECIMENS:  None.  TOURNIQUET TIME:  49 minutes.  DISPOSITION:  Stable to PACU.  INDICATIONS:  Mr. Michael Gentry is a 36 year old male, who injured his right hand last week.  [facility] were taken revealing a small finger metacarpal fracture.  He followed up in the office.  We discussed nonoperative and operative treatment options.  He wished to proceed with operative fixation. Risks, benefits, and alternatives of surgery were discussed including the risk of blood loss; infection; damage to nerves, vessels, tendons, ligaments, bone; failure of surgery; need for additional surgery; complications with wound healing; continued pain; nonunion; malunion; stiffness.  He voiced understanding of these risks and elected to proceed.  OPERATIVE COURSE:  After being identified preoperatively by myself, the patient and I agreed upon procedure and site of procedure.  Surgical site was marked.  The risks, benefits, and alternatives of surgery were reviewed and he wished to proceed.  Surgical consent had been signed. He was given IV Ancef as preoperative antibiotic prophylaxis.  He was transported to the operating room and placed on the operating  table in supine position with the right upper extremity on arm board.  General anesthesia was induced by anesthesiologist.  Right upper extremity was prepped and draped in normal sterile orthopedic fashion.  A surgical pause was performed between surgeons, anesthesia, and operating room staff, and all were in agreement as to the patient, procedure, and site of procedure.  Tourniquet at the proximal aspect of the extremity was inflated to 250 mmHg after exsanguination of the limb with an Esmarch bandage.  The incision was made on the dorsum of the hand over the small finger metacarpal and carried to subcutaneous tissues by spreading technique.  Bipolar electrocautery was used to obtain hemostasis.  The periosteum was incised.  The extensor tendons were retracted.  The periosteum was elevated.  The fracture was identified.  It was clear of soft tissue interposition.  There was a coronal split going proximally. The fracture was reduced under direct visualization.  It was then fitted with a plate from the ALPS set.  This plate was fashioned into an L shape.  It was secured with guide pins.  C-arm was used in AP and lateral projections to ensure appropriate reduction and position of the hardware, which was the case.  The wrist was placed through tenodesis and there was  no scissoring.  The standard AO drilling and measuring technique was used.  All holes in the plate were filled.  Good purchase was obtained in the screws.  The C-arm was then again used in AP, lateral, and oblique projections to ensure appropriate reduction and position of hardware which was the case.  The wrist was placed through tenodesis and there was no scissoring.  The wound was copiously irrigated with sterile saline.  The periosteum was repaired back over top of the plate using a 4-0 Vicryl suture.  Three inverted interrupted Vicryl sutures were placed into the subcutaneous tissues, and the skin was closed with 4-0 nylon  in a horizontal mattress fashion.  The wound was injected with 9 mL of 0.25% plain Marcaine to aid in postoperative analgesia.  It was then dressed with sterile Xeroform, 4x4s, and wrapped with a Kerlix bandage.  A volar and dorsal slab splint including the long, ring, and small fingers was placed with the MPs flexed and the IPs extended.  This was wrapped with Kerlix and Ace bandage.  Tourniquet was deflated at 49 minutes.  Fingertips were pink with brisk capillary refill after deflation of the tourniquet.  Operative drapes were broken down and the patient was awakened from anesthesia safely.  He was transferred back to stretcher and taken to PACU in stable condition.  I will see him back in the office in 1 week for postoperative followup.  I will give him Percocet 5/325 one to two p.o. q.6 hours p.r.n. pain dispensed #30.     Betha Loa, MD     KK/MEDQ  D:  08/18/2016  T:  08/18/2016  Job:  [id number]

## 2016-08-18 NOTE — Discharge Instructions (Addendum)

## 2016-08-18 NOTE — H&P (Signed)
  Michael Gentry is an 36 y.o. male.   Chief Complaint: right small metacarpal fracture HPI: 36 yo rhd male states he injured right hand when he hit a 2x4 a week ago.  [facility] revealed small finger metacarpal base fracture.  Splinted and followed up in office.  He wishes to undergo operative fixation.  Allergies:  Allergies  Allergen Reactions  . Amoxicillin Rash    Past Medical History:  Diagnosis Date  . Metacarpal bone fracture 07/2016   right small    Past Surgical History:  Procedure Laterality Date  . INGUINAL HERNIA REPAIR Left     Family History: History reviewed. No pertinent family history.  Social History:   reports that he has been smoking.  He has a 7.50 pack-year smoking history. He has never used smokeless tobacco. He reports that he does not drink alcohol or use drugs.  Medications: Medications Prior to Admission  Medication Sig Dispense Refill  . HYDROcodone-acetaminophen (NORCO/VICODIN) 5-325 MG tablet Take 1 tablet by mouth every 6 (six) hours as needed for moderate pain.    . Multiple Vitamins-Minerals (MULTIVITAMIN GUMMIES ADULT PO) Take by mouth.      No results found for this or any previous visit (from the past 48 hour(s)).  No results found.   A comprehensive review of systems was negative.  Blood pressure 124/73, pulse 66, temperature 97.8 F (36.6 C), temperature source Oral, resp. rate 18, height 5\' 6"  (1.676 m), weight 69.4 kg (153 lb), SpO2 100 %.  General appearance: alert, cooperative and appears stated age Head: Normocephalic, without obvious abnormality, atraumatic Neck: supple, symmetrical, trachea midline Resp: clear to auscultation bilaterally Cardio: regular rate and rhythm GI: non-tender Extremities: Intact sensation and capillary refill all digits.  +epl/fpl/io.  No wounds.  Pulses: 2+ and symmetric Skin: Skin color, texture, turgor normal. No rashes or lesions Neurologic: Grossly  normal Incision/Wound:none  Assessment/Plan Right small finger metacarpal fracture.  Non operative and operative treatment options were discussed with the patient and patient wishes to proceed with operative treatment. Risks, benefits, and alternatives of surgery were discussed and the patient agrees with the plan of care.   Key Cen R 08/18/2016, 8:32 AM

## 2016-08-18 NOTE — Op Note (Signed)
[id number] 

## 2016-08-18 NOTE — Transfer of Care (Signed)
Immediate Anesthesia Transfer of Care Note  Patient: Michael Gentry  Procedure(s) Performed: Procedure(s): OPEN REDUCTION INTERNAL FIXATION (ORIF) RIGHT SMALL FINGER METACARPAL FRACTURE (Right)  Patient Location: PACU  Anesthesia Type:General  Level of Consciousness: awake, alert  and oriented  Airway & Oxygen Therapy: Patient Spontanous Breathing and Patient connected to face mask oxygen  Post-op Assessment: Report given to RN and Post -op Vital signs reviewed and stable  Post vital signs: Reviewed and stable  Last Vitals:  Vitals:   08/18/16 1107 08/18/16 1108  BP: 111/82   Pulse:  91  Resp: (!) 36 20  Temp:  (P) 36.5 C    Last Pain:  Vitals:   08/18/16 0837  TempSrc:   PainSc: 4       Patients Stated Pain Goal: 2 (08/18/16 0837)  Complications: No apparent anesthesia complications

## 2016-08-18 NOTE — Anesthesia Procedure Notes (Signed)
Procedure Name: LMA Insertion Date/Time: 08/18/2016 9:54 AM Performed by: Zenia ResidesPAYNE, Misaki Sozio D Pre-anesthesia Checklist: Patient identified, Emergency Drugs available, Suction available and Patient being monitored Patient Re-evaluated:Patient Re-evaluated prior to inductionOxygen Delivery Method: Circle system utilized Preoxygenation: Pre-oxygenation with 100% oxygen Intubation Type: IV induction Ventilation: Mask ventilation without difficulty LMA: LMA inserted LMA Size: 4.0 Number of attempts: 1 Airway Equipment and Method: Bite block Placement Confirmation: positive ETCO2 Tube secured with: Tape Dental Injury: Teeth and Oropharynx as per pre-operative assessment

## 2016-08-18 NOTE — Brief Op Note (Signed)
08/18/2016  10:59 AM  PATIENT:  Michael MedicoWilliam D Susi Gentry  36 y.o. male  PRE-OPERATIVE DIAGNOSIS:  right small finger metacarpal fracture  POST-OPERATIVE DIAGNOSIS:  right small finger metacarpal fracture  PROCEDURE:  Procedure(s): OPEN REDUCTION INTERNAL FIXATION (ORIF) RIGHT SMALL FINGER METACARPAL FRACTURE (Right)  SURGEON:  Surgeon(s) and Role:    * Betha LoaKevin Darryn Kydd, MD - Primary    * Cindee SaltGary Dearius Hoffmann, MD - Assisting  PHYSICIAN ASSISTANT:   ASSISTANTS: Cindee SaltGary Taniesha Glanz, MD   ANESTHESIA:   general  EBL:  Total I/O In: 1000 [I.V.:1000] Out: 2 [Blood:2]  BLOOD ADMINISTERED:none  DRAINS: none   LOCAL MEDICATIONS USED:  MARCAINE     SPECIMEN:  No Specimen  DISPOSITION OF SPECIMEN:  N/A  COUNTS:  YES  TOURNIQUET:   Total Tourniquet Time Documented: Upper Arm (Right) - 49 minutes Total: Upper Arm (Right) - 49 minutes   DICTATION: .Other Dictation: Dictation Number [id number]  PLAN OF CARE: Discharge to home after PACU  PATIENT DISPOSITION:  PACU - hemodynamically stable.

## 2016-08-18 NOTE — Anesthesia Preprocedure Evaluation (Signed)
Anesthesia Evaluation  Patient identified by MRN, date of birth, ID band Patient awake    Reviewed: Allergy & Precautions, NPO status , Patient's Chart, lab work & pertinent test results  Airway Mallampati: I  TM Distance: >3 FB Neck ROM: Full    Dental   Pulmonary Current Smoker,    Pulmonary exam normal        Cardiovascular Normal cardiovascular exam     Neuro/Psych    GI/Hepatic   Endo/Other    Renal/GU      Musculoskeletal   Abdominal   Peds  Hematology   Anesthesia Other Findings   Reproductive/Obstetrics                             Anesthesia Physical Anesthesia Plan  ASA: II  Anesthesia Plan: General   Post-op Pain Management:    Induction: Intravenous  Airway Management Planned: LMA  Additional Equipment:   Intra-op Plan:   Post-operative Plan: Extubation in OR  Informed Consent: I have reviewed the patients History and Physical, chart, labs and discussed the procedure including the risks, benefits and alternatives for the proposed anesthesia with the patient or authorized representative who has indicated his/her understanding and acceptance.     Plan Discussed with: CRNA and Surgeon  Anesthesia Plan Comments:         Anesthesia Quick Evaluation  

## 2016-08-19 ENCOUNTER — Encounter (HOSPITAL_BASED_OUTPATIENT_CLINIC_OR_DEPARTMENT_OTHER): Payer: Self-pay | Admitting: Orthopedic Surgery

## 2016-08-19 NOTE — Addendum Note (Signed)
Addendum  created 08/19/16 1156 by Jewel Baizeimothy D Kemisha Bonnette, CRNA   Charge Capture section accepted

## 2019-01-02 ENCOUNTER — Encounter: Payer: Self-pay | Admitting: Family Medicine

## 2019-01-07 ENCOUNTER — Ambulatory Visit: Payer: Self-pay | Admitting: Family Medicine

## 2019-01-15 ENCOUNTER — Ambulatory Visit: Payer: Self-pay | Admitting: Family Medicine

## 2024-05-29 ENCOUNTER — Ambulatory Visit: Admitting: Podiatry

## 2024-06-21 ENCOUNTER — Ambulatory Visit: Admitting: Podiatry

## 2024-07-05 ENCOUNTER — Ambulatory Visit: Admitting: Podiatry
